# Patient Record
Sex: Male | Born: 2000 | Race: White | Hispanic: No | Marital: Single | Smoking: Never smoker
Health system: Southern US, Community
[De-identification: ages and names within clinical notes are randomized; demographics above are authoritative.]

---
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
## 2018-09-23 ENCOUNTER — Other Ambulatory Visit: Payer: Self-pay

## 2018-09-23 DIAGNOSIS — Z20822 Contact with and (suspected) exposure to covid-19: Secondary | ICD-10-CM

## 2018-09-24 LAB — NOVEL CORONAVIRUS, NAA: SARS-CoV-2, NAA: NOT DETECTED

## 2019-11-17 DIAGNOSIS — F909 Attention-deficit hyperactivity disorder, unspecified type: Secondary | ICD-10-CM | POA: Insufficient documentation

## 2021-12-31 ENCOUNTER — Emergency Department
Admission: EM | Admit: 2021-12-31 | Discharge: 2021-12-31 | Disposition: A | Discharge status: Home / Self Care | Payer: Self-pay | Attending: Emergency Medicine | Admitting: Emergency Medicine

## 2021-12-31 ENCOUNTER — Other Ambulatory Visit: Payer: Self-pay

## 2021-12-31 ENCOUNTER — Emergency Department: Payer: Self-pay

## 2021-12-31 ENCOUNTER — Encounter: Payer: Self-pay | Admitting: Emergency Medicine

## 2021-12-31 DIAGNOSIS — X509XXA Other and unspecified overexertion or strenuous movements or postures, initial encounter: Secondary | ICD-10-CM | POA: Diagnosis not present

## 2021-12-31 DIAGNOSIS — M25572 Pain in left ankle and joints of left foot: Secondary | ICD-10-CM | POA: Insufficient documentation

## 2021-12-31 DIAGNOSIS — Y990 Civilian activity done for income or pay: Secondary | ICD-10-CM | POA: Diagnosis not present

## 2021-12-31 MED ORDER — ONDANSETRON 4 MG PO TBDP: 4.0000 mg | ORAL_TABLET | Freq: Three times a day (TID) | ORAL | 0 refills | Status: AC | PRN

## 2021-12-31 MED ORDER — HYDROCODONE-ACETAMINOPHEN 5-325 MG PO TABS: 1.0000 | ORAL_TABLET | Freq: Four times a day (QID) | ORAL | 0 refills | Status: AC | PRN

## 2021-12-31 MED ORDER — HYDROCODONE-ACETAMINOPHEN 5-325 MG PO TABS
1.0000 | ORAL_TABLET | Freq: Once | ORAL | Status: AC
  Administered 2021-12-31: 1 via ORAL
  Filled 2021-12-31: qty 1

## 2021-12-31 MED ORDER — ONDANSETRON 4 MG PO TBDP
4.0000 mg | ORAL_TABLET | Freq: Once | ORAL | Status: AC
  Administered 2021-12-31: 4 mg via ORAL
  Filled 2021-12-31: qty 1

## 2021-12-31 NOTE — ED Provider Notes (Signed)
[facility] Provider Note  Patient Contact: 4:40 PM (approximate)   History   Ankle Injury   HPI  Kurt Dunn is a 21 y.o. male presents to the emergency department with left ankle pain after an inversion type ankle injury.  Patient has significant swelling over his lateral malleolus and tenderness to palpation along the anterior aspect of the ankle.  No similar injuries in the past.  He has had difficulty bearing weight since injury occurred.      Physical Exam   Triage Vital Signs: ED Triage Vitals  Enc Vitals Group     BP 12/31/21 1503 (!) 141/80     Pulse Rate 12/31/21 1503 80     Resp 12/31/21 1503 16     Temp 12/31/21 1503 99.2 F (37.3 C)     Temp Source 12/31/21 1503 Oral     SpO2 12/31/21 1503 97 %     Weight 12/31/21 1430 150 lb (68 kg)     Height 12/31/21 1430 5\' 10"  (1.778 m)     Head Circumference --      Peak Flow --      Pain Score 12/31/21 1430 7     Pain Loc --      Pain Edu? --      Excl. in GC? --     Most recent vital signs: Vitals:   12/31/21 1503  BP: (!) 141/80  Pulse: 80  Resp: 16  Temp: 99.2 F (37.3 C)  SpO2: 97%     General: Alert and in no acute distress. Eyes:  PERRL. EOMI. Head: No acute traumatic findings ENT:      Nose: No congestion/rhinnorhea.      Mouth/Throat: Mucous membranes are moist.  Neck: No stridor. No cervical spine tenderness to palpation. Cardiovascular:  Good peripheral perfusion Respiratory: Normal respiratory effort without tachypnea or retractions. Lungs CTAB. Good air entry to the bases with no decreased or absent breath sounds. Gastrointestinal: Bowel sounds 4 quadrants. Soft and nontender to palpation. No guarding or rigidity. No palpable masses. No distention. No CVA tenderness. Musculoskeletal: Full range of motion to all extremities.  Patient has tenderness to palpation over anterior aspect of left ankle and lateral malleolus.  Palpable dorsalis pedis pulse, left.   Capillary refill less than 2 seconds on the left. Neurologic:  No gross focal neurologic deficits are appreciated.  Skin:   No rash noted Other:   ED Results / Procedures / Treatments   Labs (all labs ordered are listed, but only abnormal results are displayed) Labs Reviewed - No data to display     RADIOLOGY  I personally viewed and evaluated these images as part of my medical decision making, as well as reviewing the written report by the radiologist.  ED Provider Interpretation: X-ray left ankle: Patient has small avulsion within the joint space of medial ankle.   PROCEDURES:  Critical Care performed: No  Procedures   MEDICATIONS ORDERED IN ED: Medications  HYDROcodone-acetaminophen (NORCO/VICODIN) 5-325 MG per tablet 1 tablet (has no administration in time range)  ondansetron (ZOFRAN-ODT) disintegrating tablet 4 mg (has no administration in time range)     IMPRESSION / MDM / ASSESSMENT AND PLAN / ED COURSE  I reviewed the triage vital signs and the nursing notes.                              Assessment and plan Ankle pain 21 year old male  presents to the emergency department with acute left ankle pain.  Vital signs are reassuring at triage.  On exam, patient was alert and nontoxic-appearing.  He did have reproducible tenderness to palpation over the lateral aspect of the left ankle in the anterior aspect of the ankle.  I am concerned for possible sprain/bony avulsion.  Patient was placed in a cam boot and advised to follow-up with podiatry.  He was given Norco in the emergency department and discharged with a short course of Norco.  Crutches were provided and a work note was given.  Clinical Course as of 12/31/21 1643  Mon Dec 31, 2021  1613 DG Ankle Complete Left [SH]    Clinical Course User Index [SH] Yvonna Alanis, [place]     FINAL CLINICAL IMPRESSION(S) / ED DIAGNOSES   Final diagnoses:  Acute left ankle pain     Rx / DC Orders   ED  Discharge Orders          Ordered    HYDROcodone-acetaminophen (NORCO/VICODIN) 5-325 MG tablet  Every 6 hours PRN        12/31/21 1639    ondansetron (ZOFRAN-ODT) 4 MG disintegrating tablet  Every 8 hours PRN        12/31/21 1639             Note:  This document was prepared using Dragon voice recognition software and may include unintentional dictation errors.   Pia Mau [place], PA-C 12/31/21 1643    Concha Se, MD 12/31/21 [phone]

## 2021-12-31 NOTE — Discharge Instructions (Addendum)
You have been diagnosed with an ankle sprain with possible small avulsion You should wear your cam boot during the day but you can take it off with showering, sleeping and rested elevation. You can apply ice for 10 to 15 minutes for every hour sitting, usually 2 to 3 hours/day. You will need to make an appointment with podiatry, please see phone number for Dr. Alberteen Spindle

## 2021-12-31 NOTE — ED Triage Notes (Signed)
C/O right ankle injury.  States rolled ankle at work.
